# Patient Record
Sex: Male | Born: 1975 | Hispanic: No | Marital: Married | Smoking: Current every day smoker
Health system: Southern US, Community
[De-identification: ages and names within clinical notes are randomized; demographics above are authoritative.]

---

## 2002-09-25 ENCOUNTER — Encounter: Admission: RE | Admit: 2002-09-25 | Discharge: 2002-09-25 | Payer: Self-pay | Admitting: Specialist

## 2002-09-25 ENCOUNTER — Encounter: Payer: Self-pay | Admitting: Specialist

## 2004-08-20 ENCOUNTER — Emergency Department (HOSPITAL_COMMUNITY): Admission: EM | Admit: 2004-08-20 | Discharge: 2004-08-20 | Payer: Self-pay | Admitting: Emergency Medicine

## 2013-03-08 ENCOUNTER — Ambulatory Visit (INDEPENDENT_AMBULATORY_CARE_PROVIDER_SITE_OTHER): Payer: BC Managed Care – PPO | Admitting: Emergency Medicine

## 2013-03-08 VITALS — BP 118/72 | HR 113 | Temp 101.6°F | Resp 18 | Ht 66.0 in | Wt 127.0 lb

## 2013-03-08 DIAGNOSIS — R509 Fever, unspecified: Secondary | ICD-10-CM

## 2013-03-08 DIAGNOSIS — J111 Influenza due to unidentified influenza virus with other respiratory manifestations: Secondary | ICD-10-CM

## 2013-03-08 DIAGNOSIS — R059 Cough, unspecified: Secondary | ICD-10-CM

## 2013-03-08 DIAGNOSIS — J101 Influenza due to other identified influenza virus with other respiratory manifestations: Secondary | ICD-10-CM

## 2013-03-08 DIAGNOSIS — R05 Cough: Secondary | ICD-10-CM

## 2013-03-08 LAB — POCT INFLUENZA A/B
Influenza A, POC: POSITIVE
Influenza B, POC: NEGATIVE

## 2013-03-08 MED ORDER — OSELTAMIVIR PHOSPHATE 75 MG PO CAPS: 75.0000 mg | ORAL_CAPSULE | Freq: Two times a day (BID) | ORAL | Status: DC

## 2013-03-08 NOTE — Progress Notes (Signed)
   Subjective:    Patient ID: Thomas Cantu, male    DOB: 29-Aug-1975, 37 y.o.   MRN: [id number]  HPI  37y.o. Male presents to clinic today with cough, chills, congestion. Symptoms started over past 2 days. Took tylenol for symptoms yesturday  Review of Systems     Objective:   Physical Exam H. EENT exam is unremarkable except for rhinorrhea. Neck is supple chest clear to auscultation and percussion. Heart regular rate no murmurs rubs or gallops  Results for orders placed in visit on 03/08/13  POCT INFLUENZA A/B      Result Value Range   Influenza A, POC Positive     Influenza B, POC            Assessment & Plan:  Testing positive for influenza will treat with Tamiflu.

## 2013-03-08 NOTE — Patient Instructions (Addendum)
Cm A (H1N1) (Influenza A [H1N1]) H1N1 tr??c ?y ???c g?i l "cm l?n" l m?t vi rt cm m?i gy b?nh ? ng??i. Vi rt H1N1 khc v?i cc vi rt cm ma. Tuy nhin, cc tri?u ch?ng c?a H1N1 t??ng t? nh? cm ma v n ly lan t? ng??i ny sang ng??i khc. B?n c th? c nguy c? b? nh?ng v?n ?? nghim tr?ng cao h?n n?u b?n c nh?ng tnh tr?ng b?nh n?i khoa nghim tr?ng. CDC v T? Ch?c Y T? Th? Gi?i ?ang theo di cc tr??ng h?p ???c bo co trn kh?p th? gi?i.  NGUYN NHN  Cm ???c xem l ly lan ch? y?u t? ng??i sang ng??i thng qua ho ho?c h?t h?i c?a ng??i b? nhi?m b?nh.  M?t ng??i c th? b? nhi?m b?nh khi ch?m vo th? g ? c vi rt v sau ? ch?m vo mi?ng ho?c m?i h?. TRI?U CH?NG  S?t.  ?au ??u.  M?t m?i.  Ho.  ?au h?ng.  Ch?y n??c m?i ho?c ng?t m?i.  ?au nh?c ton thn.  Tiu ch?y v nn m?a Nh?ng tri?u ch?ng ny ???c ?? c?p l nh?ng tri?u ch?ng 'gi?ng nh? cm'. Nhi?u b?nh l khc nhau bao g?m, c?m l?nh, c th? c nh?ng tri?u ch?ng t??ng t?. CH?N ?ON  C nh?ng xt nghi?m c th? cho bi?t b?n b? nhi?m vi rt H1N1 hay khng.  Nh?ng tr??ng h?p ???c xc ??nh nhi?m H1N1 s? ???c bo co cho phng y t? c?a ti?u bang ho?c ??a ph??ng.  Bc s? c th? c?n khm ?? xc ??nh xem b?n c b? nhi?m trng do bi?n ch?ng c?a cm hay khng. H??NG D?N CH?M SC T?I NH  Lun c?p nh?t thng tin. Truy c?p vo trang web c?a CDC ?? bi?t nh?ng khuy?n ngh? m?i nh?t. Truy c?p vo www.cdc.gov/H1N1flu/. B?n c?ng c th? g?i ??n 1-800-CDC-INFO (1-800-232-4636).  Yu c?u tr? gip s?m n?u b?n b? b?t k? tri?u ch?ng no ? trn.  N?u b?n c nguy c? cao b? cc bi?n ch?ng c?a cm, hy ni chuy?n v?i chuyn gia ch?m sc s?c kh?e c?a b?n ngay sau khi b?n c nh?ng tri?u ch?ng gi?ng nh? cm. Nh?ng ng??i b? nguy c? b? bi?n ch?ng cao h?n bao g?m:  Nh?ng ng??i t? 63 tu?i tr? ln.  Nh?ng ng??i c nh?ng tnh tr?ng b?nh n?i khoa m?n tnh.  Ph? n? mang thai.  Tr? nh?.  Chuyn gia ch?m sc s?c kh?e c th? khuy?n ngh?  dng thu?c khng vi rt ?? gip ?i?u tr? cm.  N?u b?n b? cm, hy ngh? ng?i th?t nhi?u, u?ng ?? n??c v dung d?ch ?? gi? cho n??c ti?u trong ho?c vng nh?t v trnh s? d?ng r??u ho?c thu?c l.  B?n c th? s? d?ng thu?ckhng c?n k ??n ?? lm gi?m tri?u ch?ng cm n?u chuyn gia ch?m sc s?c kh?e ch?p thu?n. (Khng bao gi? s? d?ng aspirin cho tr? em ho?c tr? v? thnh nin c nh?ng tri?u ch?ng gi?ng nh? cm, ??c bi?t khi b? s?t). ?I?U TR? N?u b? b?nh, b?n c th? s? d?ng thu?c khng vi rt. Nh?ng thu?c ny c th? gi?m nh? b?nh c?a b?n v lm cho b?n c?m th?y kh?e nhanh h?n. Nn ?i?u tr? ngay sau khi b?t ??u b? b?nh. N ch? c hi?u qu? n?u ???c s? d?ng trong ngy ??u tin khi b?t ??u b? b?nh. Ch? chuyn gia ch?m sc s?c kh?e c?a b?n m?i c th? k ??n thu?c khng vi rt. PHNG   NG?A  Che m?i v mi?ng b?ng kh?n gi?y ho?c cnh tay khi ho ho?c h?t h?i. V?t b? kh?n gi?y.  R?a tay th??ng xuyn b?ng x phng v n??c ?m, ??c bi?t sau khi ho ho?c h?t h?i. Nh?ng ch?t t?y r?a c ch?a c?n c?ng hi?u qu? trong vi?c ch?ng l?i vi trng.  Trnh ch?m vo m?t, m?i ho?c mi?ng. ?y l m?t cch ly lan vi trng.  Trnh ti?p xc v?i ng??i b? b?nh. Tun th? khuy?n co y t? cng c?ng lin quan ??n vi?c ?ng c?a tr??ng h?c. Trnh ?m ?ng.  ? nh n?u b?n b? b?nh. H?n ch? ti?p xc v?i ng??i khc ?? trnh ly b?nh cho h?. Nh?ng ng??i b? nhi?m vi rt H1N1 c th? ly nhi?m sang ng??i khc b?t c? lc no k? t? 1 ngy tr??c khi c?m th?y b? b?nh cho ??n 5-7 ngy sau khi xu?t hi?n tri?u ch?ng cm.  V?cxin H1N1 c s?n ?? gip b?o v? ch?ng l?i vi rt. Ngoi v?cxin H1N1, b?n s? c?n tim phng cm ma. V?cxin H1N1 v v?cxin cm ma c th? ???c cho dng trong cng ngy. CDC ??c bi?t khuyn ngh? s? d?ng v?cxin H1N1 cho:  Ph? n? mang thai.  Nh?ng ng??i s?ng cng v?i ho?c ch?m sc tr? t? 6 thng tu?i tr? xu?ng.  Nhn vin d?ch v? ch?m sc s?c kh?e v c?p c?u.  Nh?ng ng??i t? 6 thng tu?i ??n 24 tu?i.  Nh?ng ng??i t? 25 ??n 64 tu?i c  nguy c? b? nhi?m H1N1 cao h?n do cc r?i lo?n s?c kh?e m?n tnh ho?c v?n ?? v? h? mi?n d?ch. M?T N? Trong mi tr??ng ? c?ng ??ng v t?i nh, vi?c s? d?ng m?t n? v kh?u trang N95 th??ng khng ???c khuyn dng. Trong m?t s? tr??ng h?p nh?t ??nh, m?t n? ho?c kh?u trang N95 c th? ???c s? d?ng cho ng??i c t?ng nguy c? b? b?nh n?ng t? cm. Chuyn gia ch?m sc s?c kh?e c?a b?n c th? t? v?n thm v? vi?c s? d?ng m?t n?. ? TR? EM, NH?NG D?U HI?U C?NH BO C?P C?U C?N ?I KHM KH?N C?P:  Th? nhanh ho?c kh th?.  Mu da h?i xanh.  Khng u?ng ?? n??c.  Khng th?c gi?c ho?c khng t??ng tc bnh th??ng.  Kh tnh t?i m?c tr? khng mu?n ???c m.  Nhi?t ?? ? mi?ng c?a con b?n trn 102 F (38,9 C), khng h? khi dng thu?c.  Con b?n trn 3 thng tu?i c nhi?t ?? ?o t?i tr?c trng l 102 F (38,9 C) tr? ln.  Con b?n t? 3 thng tu?i tr? xu?ng c nhi?t ?? ?o t?i tr?c trng l 100,4 F (38 C) tr? ln.  Tri?u ch?ng gi?ng nh? cm c?i thi?n nh?ng ti pht km theo s?t v ho t? h?n. ? NG??I L?N, NH?NG D?U HI?U C?NH BO C?P C?U C?N ?I KHM KH?N C?P:  Kh th? ho?c th? d?c.  ?au ho?c t?c ng?c ho?c b?ng.  Chng m?t ??t ng?t.  L l?n.  Nn n?ng ho?c lin t?c.  Da h?i xanh.  Nhi?t ?? mi?ng c?a b?n trn 102 F (38,9 C), khng h? khi dng thu?c.  Tri?u ch?ng gi?ng nh? cm c?i thi?n nh?ng ti pht km theo s?t v ho t? h?n. HY NGAY L?P T?C ?I KHM N?U: B?n ho?c ai ? b?n bi?t g?p ph?i b?t k? tri?u ch?ng no ? trn. Khi b?n ??n trung tm c?p c?u, hy bo r?ng b?n ngh? r?ng b?n   b? cm. B?n c th? ???c yu c?u ?eo m?t n? v/ho?c ng?i trong khu v?c cch ly ?? b?o v? nh?ng ng??i khc kh?i b? ly b?nh. ??M B?O B?N:  Hi?u cc h??ng d?n ny.  S? theo di tnh tr?ng c?a mnh.  S? yu c?u tr? gip ngay l?p t?c n?u b?n c?m th?y khng ?? ho?c tnh tr?ng tr?m tr?ng h?n. M?t s? thng tin ny ???c cung c?p mi?n ph b?i CDC. Document Released: 05/20/2009 Document Revised: 10/26/2012 ExitCare Patient  Information 2014 [employer].  

## 2015-02-01 ENCOUNTER — Ambulatory Visit (INDEPENDENT_AMBULATORY_CARE_PROVIDER_SITE_OTHER): Payer: BLUE CROSS/BLUE SHIELD | Admitting: Family Medicine

## 2015-02-01 ENCOUNTER — Ambulatory Visit (INDEPENDENT_AMBULATORY_CARE_PROVIDER_SITE_OTHER): Payer: BLUE CROSS/BLUE SHIELD

## 2015-02-01 VITALS — BP 136/80 | HR 100 | Temp 97.6°F | Resp 18 | Ht 66.0 in | Wt 129.2 lb

## 2015-02-01 DIAGNOSIS — S93402A Sprain of unspecified ligament of left ankle, initial encounter: Secondary | ICD-10-CM

## 2015-02-01 DIAGNOSIS — M25472 Effusion, left ankle: Secondary | ICD-10-CM | POA: Diagnosis not present

## 2015-02-01 DIAGNOSIS — M25572 Pain in left ankle and joints of left foot: Secondary | ICD-10-CM

## 2015-02-01 DIAGNOSIS — S99912A Unspecified injury of left ankle, initial encounter: Secondary | ICD-10-CM | POA: Diagnosis not present

## 2015-02-01 NOTE — Progress Notes (Signed)
Xray read and patient discussed with Ms. Leone PayorGessner. Agree with assessment and plan of care per her note. XR report reviewed without fx.

## 2015-02-01 NOTE — Progress Notes (Signed)
   Subjective:    Patient ID: Thomas Cantu, male    DOB: 01/23/1976, 39 y.o.   MRN: [id number]  HPI Patient presents today with pain and swelling in his left foot following rolling it while walking down the stairs last night. He was able to catch himself and did not fall down. He did not have pain or swelling immediately following the incident, but had pain when he got up this morning and noticed that his ankle is swollen. He has not applied ice or taken any medication.   He works as a Radio broadcast assistantnail tech.   History reviewed. No pertinent past medical history. History reviewed. No pertinent past surgical history. Family History  Problem Relation Age of Onset  . Diabetes Mother   . Diabetes Father    Social History  Substance Use Topics  . Smoking status: Current Every Day Smoker -- 0.50 packs/day    Types: Cigarettes  . Smokeless tobacco: None  . Alcohol Use: None    Review of Systems Per HPI    Objective:   Physical Exam  Constitutional: He is oriented to person, place, and time. He appears well-developed and well-nourished.  HENT:  Head: Normocephalic and atraumatic.  Eyes: Conjunctivae are normal.  Neck: Normal range of motion. Neck supple.  Cardiovascular: Normal rate.   Pulmonary/Chest: Effort normal.  Musculoskeletal:       Left ankle: He exhibits normal range of motion.       Left foot: There is tenderness and swelling. There is normal range of motion and normal capillary refill.  Moderate swelling over left lateral foot, slight discoloration. Pain with flexion, extension, inversion, no pain with inversion. No pain over fibula or with tib-fib squeeze. No pain over 5th metatarsal. No navicular pain.   Neurological: He is alert and oriented to person, place, and time.  Skin: Skin is warm and dry.  Psychiatric: He has a normal mood and affect. His behavior is normal. Judgment and thought content normal.  Vitals reviewed.  BP 136/80 mmHg  Pulse 100  Temp(Src) 97.6 F (36.4 C)  (Oral)  Resp 18  Ht 5\' 6"  (1.676 m)  Wt 129 lb 3.2 oz (58.605 kg)  BMI 20.86 kg/m2  SpO2 98%   [facility] reading (PRIMARY) by  Dr. Neva SeatGreene- no fracture.  Assessment & Plan:  1. Ankle injury, left, initial encounter - DG Foot Complete Left; Future  2. Pain in joint, ankle and foot, left - DG Foot Complete Left; Future  3. Ankle swelling, left - DG Foot Complete Left; Future  4. Ankle sprain, left -Provided written and verbal information regarding diagnosis and treatment. - discussed normal Xray - encouraged elevation, ace bandage applied, recommended ibuprofen 400 mg TID - RTC if no improvement in 5-7 days  Olean Reeeborah Gessner, FNP-BC  [facility], [facility]  02/01/2015 9:46 AM

## 2015-02-01 NOTE — Patient Instructions (Signed)
Bong Gân C? Chân  (Ankle Sprain)  Bong gân c? chân là t?n th??ng các mô x?, ch?c ch?n (dây ch?ng) gi? cho các x??ng c?a kh?p m?t cá chân v?i nhau.   NGUYÊN NHÂN  Bong gân c? chân th??ng do té ngã ho?c v?n c? chân. Bong gân c? chân th??ng hay x?y ra nh?t khi b?n b??c ?i trên c?nh ngoài c?a chân mình và m?t cá chân xoay vào trong. Nh?ng ng??i tham gia vào các môn th? thao d? b? các lo?i ch?n th??ng này h?n.  TRI?U CH?NG  · ?au m?t ? c? chân. C?n ?au có th? xu?t hi?n khi ngh? ng?i ho?c ch? khi ??ng ho?c ?i b?.  · S?ng t?y.  · B?m tím. B?m tím có th? b? ngay l?p t?c ho?c trong vòng 1-2 ngày sau khi b? ch?n th??ng.  · Khó ??ng ho?c khó ?i b?, ??c bi?t là r? hay thay ??i h??ng.  CH?N ?OÁN  Chuyên gia ch?m sóc s?c kh?e s? h?i b?n thông tin chi ti?t v? t?n th??ng và th?c hi?n khám th?c th? c? chân ?? xác ??nh b?n có b? bong gân c? chân hay không. Trong khi khám th?c th?, chuyên gia ch?m sóc s?c kh?e s? ?n vào và ép lên các vùng c? th? c?a bàn chân và c? chân c?a b?n. Chuyên gia ch?m sóc s?c kh?e s? tìm cách di chuy?n c? chân c?a b?n theo m?t s? chi?u nh?t ??nh. Ki?m tra b?ng ch?p X-quang có th? ???c th?c hi?n ?? ch?c ch?n là không có x??ng b? gãy ho?c không có dây ch?ng b? tách r?i kh?i m?t trong các x??ng ? c? chân c?a b?n (gãy b?t x??ng).  ?I?U TR?  M?t s? lo?i ni?ng nh?t ??nh có th? giúp làm ?n ??nh c? chân. Chuyên gia ch?m sóc s?c kh?e có th? ?? ngh? s? d?ng nh?ng lo?i ni?ng này. Chuyên gia ch?m sóc s?c kh?e có th? khuy?n ngh? s? d?ng thu?c gi?m ?au. N?u bong gân n?ng, chuyên gia ch?m sóc s?c kh?e có th? gi?i thi?u b?n ??n m?t bác s? ph?u thu?t giúp ph?c h?i ch?c n?ng cho các b? ph?n c?a h? th?ng x??ng c?a b?n (ch?nh hình) ho?c chuyên gia v?t lý tr? li?u.  H??NG D?N CH?M SÓC T?I NHÀ  · Ch??m ?á vào ch? b? t?n th??ng t? 1-2 ngày ho?c theo ch? d?n c?a chuyên gia ch?m sóc s?c kh?e. Vi?c ch??m ?á s? giúp gi?m viêm và gi?m ?au.    Cho ?á l?nh vào túi nh?a.    ??t kh?n t?m gi?a da và túi.    Ch??m ?á l?nh trong 15 ??n 20 phút  m?i l?n, 2 gi? m?t l?n trong khi th?c.  · Ch? s? d?ng thu?c không c?n kê toa ho?c thu?c c?n kê toa ?? gi?m ?au, gi?m c?m giác khó ch?u ho?c h? s?t theo ch? d?n c?a chuyên gia ch?m sóc s?c kh?e c?a b?n.  · Nâng c? chân b? th??ng cao h?n tim c?a b?n càng nhi?u càng t?t trong 2-3 ngày.  · N?u chuyên gia ch?m sóc s?c kh?e ?? ngh? s? d?ng n?ng, hãy s? d?ng chúng theo h??ng d?n. D?n d?n ?? tr?ng l??ng c? th? tì lên c? chân b? ?nh h??ng. Ti?p t?c s? d?ng n?ng ho?c g?y cho ??n khi b?n có th? ?i b? mà không có c?m giác ?au ? c? chân.  · N?u b?n ???c bó b?t, hãy gi? b?t theo ch? d?n c?a chuyên gia ch?m sóc s?c   t ln ch? b b?t. Khng ?? b?t b? ??t. B?n c th? tho b?t ?? t?m vi sen ho?c b?n t?m.  B?n c th? ? ???c ch? ??nh b?ng ?n h?i ?? ?eo quanh m?t c chn ?? h? tr?. N?u b?ng ?n h?i qu ch?t (b?n c c?m gic t ho?c ?au bu?t ? bn chn ho?c bn chn b?n tr? nn l?nh v xanh), hy ?i?u ch?nh b?ng ?? lm cho n tho?i mi.  N?u s? d?ng n?p kh, b?n c th? th?i thm kh vo n?p ho?c x? b?t kh ra ?? c c?m gic tho?i mi h?n. B?n c th? tho n?p vo ban ?m v tr??c khi t?m vi sen ho?c t?m b?n. Ng? ngu?y ngn chn trong n?p vi l?n trong ngy ?? gi?m s?ng. HY ?I KHM N?U:  B?n b? thm tm ho?c s?ng gia t?ng r?t nhanh.  B?n c?m th?y v cng l?nh ? cc ngn chn ho?c b?n m?t c?m gic ? bn chn.  B?n khng h?t ?au sau khi dng thu?c. HY NGAY L?P T?C ?I KHM N?U:  Ngn chn c?a b?n t ho?c xanh.  B?n b? ?au cng ngy cng n?ng ln. HY CH?C CH?N R?NG B?N:  Hi?u r nh?ng h??ng d?n ny.  S? theo di tnh tr?ng b?nh c?a b?n.  S? yu c?u tr? gip n?u b?n khng ?? ho?c tnh tr?ng tr?m tr?ng h?n.   Thng tin ny khng nh?m m?c ?ch thay th? cho l?i khuyn m chuyn gia ch?m [place] s?c kh?e ni v?i qu v?. Hy b?o ??m qu v? ph?i th?o lu?n b?t k? v?n ?? g m qu v? c v?i chuyn gia ch?m [place] s?c  kh?e c?a qu v?.   Document Released: 02/23/2005 Document Revised: 10/26/2012 Elsevier Interactive Patient Education [employer]

## 2016-01-14 ENCOUNTER — Ambulatory Visit (INDEPENDENT_AMBULATORY_CARE_PROVIDER_SITE_OTHER): Payer: Self-pay | Admitting: Family Medicine

## 2016-01-14 VITALS — BP 122/72 | HR 96 | Temp 98.2°F | Resp 17 | Ht 66.5 in | Wt 131.0 lb

## 2016-01-14 DIAGNOSIS — B029 Zoster without complications: Secondary | ICD-10-CM

## 2016-01-14 MED ORDER — VALACYCLOVIR HCL 1 G PO TABS: 1000.0000 mg | ORAL_TABLET | Freq: Three times a day (TID) | ORAL | 0 refills | Status: DC

## 2016-01-14 MED ORDER — HYDROCODONE-ACETAMINOPHEN 5-325 MG PO TABS: 1.0000 | ORAL_TABLET | Freq: Four times a day (QID) | ORAL | 0 refills | Status: DC | PRN

## 2016-01-14 NOTE — Patient Instructions (Addendum)
Ibuprofen 600 mg up to every 6 hours as needed with food. For increased pain, can take 1-2 hydrocodone every 6 hours as needed. Start Valtrex 1 pill 3 times per day for shingles.   Shingles Shingles, which is also known as herpes zoster, is an infection that causes a painful skin rash and fluid-filled blisters. Shingles is not related to genital herpes, which is a sexually transmitted infection.   Shingles only develops in people who:  Have had chickenpox.  Have received the chickenpox vaccine. (This is rare.) CAUSES Shingles is caused by varicella-zoster virus (VZV). This is the same virus that causes chickenpox. After exposure to VZV, the virus stays in the body in an inactive (dormant) state. Shingles develops if the virus reactivates. This can happen many years after the initial exposure to VZV. It is not known what causes this virus to reactivate. RISK FACTORS People who have had chickenpox or received the chickenpox vaccine are at risk for shingles. Infection is more common in people who:  Are older than age 42.  Have a weakened defense (immune) system, such as those with HIV, AIDS, or cancer.  Are taking medicines that weaken the immune system, such as transplant medicines.  Are under great stress. SYMPTOMS Early symptoms of this condition include itching, tingling, and pain in an area on your skin. Pain may be described as burning, stabbing, or throbbing. A few days or weeks after symptoms start, a painful red rash appears, usually on one side of the body in a bandlike or beltlike pattern. The rash eventually turns into fluid-filled blisters that break open, scab over, and dry up in about 2-3 weeks. At any time during the infection, you may also develop:  A fever.  Chills.  A headache.  An upset stomach. DIAGNOSIS This condition is diagnosed with a skin exam. Sometimes, skin or fluid samples are taken from the blisters before a diagnosis is made. These samples are examined  under a microscope or sent to a lab for testing. TREATMENT There is no specific cure for this condition. Your health care provider will probably prescribe medicines to help you manage pain, recover more quickly, and avoid long-term problems. Medicines may include:  Antiviral drugs.  Anti-inflammatory drugs.  Pain medicines. If the area involved is on your face, you may be referred to a specialist, such as an eye doctor (ophthalmologist) or an ear, nose, and throat (ENT) doctor to help you avoid eye problems, chronic pain, or disability. HOME CARE INSTRUCTIONS Medicines  Take medicines only as directed by your health care provider.  Apply an anti-itch or numbing cream to the affected area as directed by your health care provider. Blister and Rash Care  Take a cool bath or apply cool compresses to the area of the rash or blisters as directed by your health care provider. This may help with pain and itching.  Keep your rash covered with a loose bandage (dressing). Wear loose-fitting clothing to help ease the pain of material rubbing against the rash.  Keep your rash and blisters clean with mild soap and cool water or as directed by your health care provider.  Check your rash every day for signs of infection. These include redness, swelling, and pain that lasts or increases.  Do not pick your blisters.  Do not scratch your rash. General Instructions  Rest as directed by your health care provider.  Keep all follow-up visits as directed by your health care provider. This is important.  Until your blisters scab  over, your infection can cause chickenpox in people who have never had it or been vaccinated against it. To prevent this from happening, avoid contact with other people, especially:  Babies.  Pregnant women.  Children who have eczema.  Elderly people who have transplants.  People who have chronic illnesses, such as leukemia or AIDS. SEEK MEDICAL CARE IF:  Your pain is  not relieved with prescribed medicines.  Your pain does not get better after the rash heals.  Your rash looks infected. Signs of infection include redness, swelling, and pain that lasts or increases. SEEK IMMEDIATE MEDICAL CARE IF:  The rash is on your face or nose.  You have facial pain, pain around your eye area, or loss of feeling on one side of your face.  You have ear pain or you have ringing in your ear.  You have loss of taste.  Your condition gets worse.   This information is not intended to replace advice given to you by your health care provider. Make sure you discuss any questions you have with your health care provider.   Document Released: 02/23/2005 Document Revised: 03/16/2014 Document Reviewed: 01/04/2014 Elsevier Interactive Patient Education 2016 [employer]    IF you received an x-ray today, you will receive an invoice from [facility]. Please contact [facility] at [phone] with questions or concerns regarding your invoice.   IF you received labwork today, you will receive an invoice from [facility]. Please contact [facility] at [phone] with questions or concerns regarding your invoice.   Our billing staff will not be able to assist you with questions regarding bills from these companies.  You will be contacted with the lab results as soon as they are available. The fastest way to get your results is to activate your My Chart account. Instructions are located on the last page of this paperwork. If you have not heard from us regarding the results in 2 weeks, please contact this office.

## 2016-01-14 NOTE — Progress Notes (Signed)
By signing my name below, I, Mesha Guinyard, attest that this documentation has been prepared under the direction and in the presence of Meredith Staggers, MD.  Electronically Signed: Arvilla Market, Medical Scribe. 01/14/16. 10:22 AM.  Subjective:    Patient ID: Thomas Cantu, male    DOB: 1975-05-15, 40 y.o.   MRN: [id number]  HPI Chief Complaint  Patient presents with  . Rash    HPI Comments: Thomas Cantu is a 40 y.o. male who presents to the [facility] complaining of rash on his left arm and left side of chest onset 3 days. Reports associated sx's of burning and itching from sites as well as sleep disturbance. Uses OTC cream and advil without relief to his symptoms. Deneis fever, and feeling ill.  There are no active problems to display for this patient.  No past medical history on file. No past surgical history on file. No Known Allergies Prior to Admission medications   Not on File   Social History   Social History  . Marital status: Married    Spouse name: N/A  . Number of children: N/A  . Years of education: N/A   Occupational History  . Not on file.   Social History Main Topics  . Smoking status: Current Every Day Smoker    Packs/day: 0.75    Years: 20.00    Types: Cigarettes  . Smokeless tobacco: Never Used  . Alcohol use No  . Drug use: No  . Sexual activity: No   Other Topics Concern  . Not on file   Social History Narrative  . No narrative on file   Review of Systems  Constitutional: Negative for fever.  Skin: Positive for rash.  Psychiatric/Behavioral: Positive for sleep disturbance.   Objective:  Physical Exam  Constitutional: He appears well-developed and well-nourished. No distress.  HENT:  Head: Normocephalic and atraumatic.  Eyes: Conjunctivae are normal.  Neck: Neck supple.  Cardiovascular: Normal rate.   Pulmonary/Chest: Effort normal.  Neurological: He is alert.  Skin: Skin is warm and dry.  Left upper chest  wall; there is a patch of erythema with vesicle centrally Left under surface of arm; there are other patches of vesicles overlying erythema Appears to be probable T1 distribution  Psychiatric: He has a normal mood and affect. His behavior is normal.  Nursing note and vitals reviewed.  BP 122/72 (BP Location: Right Arm, Patient Position: Sitting, Cuff Size: Normal)   Pulse 96   Temp 98.2 F (36.8 C) (Oral)   Resp 17   Ht 5' 6.5" (1.689 m)   Wt 131 lb (59.4 kg)   SpO2 98%   BMI 20.83 kg/m  Assessment & Plan:   Thomas Cantu is a 40 y.o. male Herpes zoster without complication - Plan: valACYclovir (VALTREX) 1000 MG tablet, HYDROcodone-acetaminophen (NORCO/VICODIN) 5-325 MG tablet  - Rash appears to be consistent with probable T1 herpes zoster on the left. Start Valtrex 1 g 3 times a day, continue ibuprofen as needed during the day, but for increased or breakthrough pain, hydrocodone prescription given. RTC precautions. Understanding expressed. Actuary, but declined.   Meds ordered this encounter  Medications  . valACYclovir (VALTREX) 1000 MG tablet    Sig: Take 1 tablet (1,000 mg total) by mouth 3 (three) times daily.    Dispense:  21 tablet    Refill:  0  . HYDROcodone-acetaminophen (NORCO/VICODIN) 5-325 MG tablet    Sig: Take 1-2 tablets by mouth every 6 (  six) hours as needed for moderate pain.    Dispense:  20 tablet    Refill:  0    Label in vietnamese   Patient Instructions   Ibuprofen 600 mg up to every 6 hours as needed with food. For increased pain, can take 1-2 hydrocodone every 6 hours as needed. Start Valtrex 1 pill 3 times per day for shingles.   Shingles Shingles, which is also known as herpes zoster, is an infection that causes a painful skin rash and fluid-filled blisters. Shingles is not related to genital herpes, which is a sexually transmitted infection.   Shingles only develops in people who:  Have had chickenpox.  Have received the chickenpox  vaccine. (This is rare.) CAUSES Shingles is caused by varicella-zoster virus (VZV). This is the same virus that causes chickenpox. After exposure to VZV, the virus stays in the body in an inactive (dormant) state. Shingles develops if the virus reactivates. This can happen many years after the initial exposure to VZV. It is not known what causes this virus to reactivate. RISK FACTORS People who have had chickenpox or received the chickenpox vaccine are at risk for shingles. Infection is more common in people who:  Are older than age 42.  Have a weakened defense (immune) system, such as those with HIV, AIDS, or cancer.  Are taking medicines that weaken the immune system, such as transplant medicines.  Are under great stress. SYMPTOMS Early symptoms of this condition include itching, tingling, and pain in an area on your skin. Pain may be described as burning, stabbing, or throbbing. A few days or weeks after symptoms start, a painful red rash appears, usually on one side of the body in a bandlike or beltlike pattern. The rash eventually turns into fluid-filled blisters that break open, scab over, and dry up in about 2-3 weeks. At any time during the infection, you may also develop:  A fever.  Chills.  A headache.  An upset stomach. DIAGNOSIS This condition is diagnosed with a skin exam. Sometimes, skin or fluid samples are taken from the blisters before a diagnosis is made. These samples are examined under a microscope or sent to a lab for testing. TREATMENT There is no specific cure for this condition. Your health care provider will probably prescribe medicines to help you manage pain, recover more quickly, and avoid long-term problems. Medicines may include:  Antiviral drugs.  Anti-inflammatory drugs.  Pain medicines. If the area involved is on your face, you may be referred to a specialist, such as an eye doctor (ophthalmologist) or an ear, nose, and throat (ENT) doctor to help  you avoid eye problems, chronic pain, or disability. HOME CARE INSTRUCTIONS Medicines  Take medicines only as directed by your health care provider.  Apply an anti-itch or numbing cream to the affected area as directed by your health care provider. Blister and Rash Care  Take a cool bath or apply cool compresses to the area of the rash or blisters as directed by your health care provider. This may help with pain and itching.  Keep your rash covered with a loose bandage (dressing). Wear loose-fitting clothing to help ease the pain of material rubbing against the rash.  Keep your rash and blisters clean with mild soap and cool water or as directed by your health care provider.  Check your rash every day for signs of infection. These include redness, swelling, and pain that lasts or increases.  Do not pick your blisters.  Do not scratch  your rash. General Instructions  Rest as directed by your health care provider.  Keep all follow-up visits as directed by your health care provider. This is important.  Until your blisters scab over, your infection can cause chickenpox in people who have never had it or been vaccinated against it. To prevent this from happening, avoid contact with other people, especially:  Babies.  Pregnant women.  Children who have eczema.  Elderly people who have transplants.  People who have chronic illnesses, such as leukemia or AIDS. SEEK MEDICAL CARE IF:  Your pain is not relieved with prescribed medicines.  Your pain does not get better after the rash heals.  Your rash looks infected. Signs of infection include redness, swelling, and pain that lasts or increases. SEEK IMMEDIATE MEDICAL CARE IF:  The rash is on your face or nose.  You have facial pain, pain around your eye area, or loss of feeling on one side of your face.  You have ear pain or you have ringing in your ear.  You have loss of taste.  Your condition gets worse.   This  information is not intended to replace advice given to you by your health care provider. Make sure you discuss any questions you have with your health care provider.   Document Released: 02/23/2005 Document Revised: 03/16/2014 Document Reviewed: 01/04/2014 Elsevier Interactive Patient Education 2016 [employer]    IF you received an x-ray today, you will receive an invoice from [facility]. Please contact [facility] at [phone] with questions or concerns regarding your invoice.   IF you received labwork today, you will receive an invoice from [facility]. Please contact [facility] at [phone] with questions or concerns regarding your invoice.   Our billing staff will not be able to assist you with questions regarding bills from these companies.  You will be contacted with the lab results as soon as they are available. The fastest way to get your results is to activate your My Chart account. Instructions are located on the last page of this paperwork. If you have not heard from us regarding the results in 2 weeks, please contact this office.        I personally performed the services described in this documentation, which was scribed in my presence. The recorded information has been reviewed and considered, and addended by me as needed.   Signed,   Meredith StaggersJeffrey Alyra Patty, MD [facility].  01/14/16 10:35 AM

## 2017-11-02 ENCOUNTER — Other Ambulatory Visit: Payer: Self-pay | Admitting: Internal Medicine

## 2017-11-02 ENCOUNTER — Ambulatory Visit
Admission: RE | Admit: 2017-11-02 | Discharge: 2017-11-02 | Disposition: A | Discharge status: Home / Self Care | Payer: No Typology Code available for payment source | Source: Ambulatory Visit | Attending: Internal Medicine | Admitting: Internal Medicine

## 2017-11-02 DIAGNOSIS — Z111 Encounter for screening for respiratory tuberculosis: Secondary | ICD-10-CM

## 2021-02-19 ENCOUNTER — Other Ambulatory Visit: Payer: Self-pay | Admitting: Nurse Practitioner

## 2021-02-19 DIAGNOSIS — B181 Chronic viral hepatitis B without delta-agent: Secondary | ICD-10-CM

## 2021-02-24 ENCOUNTER — Ambulatory Visit
Admission: RE | Admit: 2021-02-24 | Discharge: 2021-02-24 | Disposition: A | Discharge status: Home / Self Care | Payer: 59 | Source: Ambulatory Visit | Attending: Nurse Practitioner | Admitting: Nurse Practitioner

## 2021-02-24 DIAGNOSIS — B181 Chronic viral hepatitis B without delta-agent: Secondary | ICD-10-CM

## 2021-04-02 DIAGNOSIS — R69 Illness, unspecified: Secondary | ICD-10-CM | POA: Diagnosis not present

## 2021-05-21 DIAGNOSIS — K7401 Hepatic fibrosis, early fibrosis: Secondary | ICD-10-CM | POA: Diagnosis not present

## 2021-05-21 DIAGNOSIS — K7581 Nonalcoholic steatohepatitis (NASH): Secondary | ICD-10-CM | POA: Diagnosis not present

## 2021-05-21 DIAGNOSIS — K760 Fatty (change of) liver, not elsewhere classified: Secondary | ICD-10-CM | POA: Diagnosis not present

## 2021-05-21 DIAGNOSIS — K7402 Hepatic fibrosis, advanced fibrosis: Secondary | ICD-10-CM | POA: Diagnosis not present

## 2021-05-21 DIAGNOSIS — R69 Illness, unspecified: Secondary | ICD-10-CM | POA: Diagnosis not present

## 2021-08-07 ENCOUNTER — Other Ambulatory Visit: Payer: Self-pay | Admitting: Nurse Practitioner

## 2021-08-07 DIAGNOSIS — B181 Chronic viral hepatitis B without delta-agent: Secondary | ICD-10-CM

## 2021-08-07 DIAGNOSIS — K760 Fatty (change of) liver, not elsewhere classified: Secondary | ICD-10-CM

## 2021-08-12 ENCOUNTER — Ambulatory Visit
Admission: RE | Admit: 2021-08-12 | Discharge: 2021-08-12 | Disposition: A | Discharge status: Home / Self Care | Payer: 59 | Source: Ambulatory Visit | Attending: Nurse Practitioner | Admitting: Nurse Practitioner

## 2021-08-12 DIAGNOSIS — R69 Illness, unspecified: Secondary | ICD-10-CM | POA: Diagnosis not present

## 2021-08-12 DIAGNOSIS — K760 Fatty (change of) liver, not elsewhere classified: Secondary | ICD-10-CM

## 2021-08-12 DIAGNOSIS — B181 Chronic viral hepatitis B without delta-agent: Secondary | ICD-10-CM

## 2021-11-07 ENCOUNTER — Emergency Department (HOSPITAL_COMMUNITY)
Admission: EM | Admit: 2021-11-07 | Discharge: 2021-11-07 | Disposition: A | Discharge status: Home / Self Care | Payer: 59 | Attending: Emergency Medicine | Admitting: Emergency Medicine

## 2021-11-07 ENCOUNTER — Emergency Department (HOSPITAL_COMMUNITY): Payer: 59

## 2021-11-07 ENCOUNTER — Other Ambulatory Visit: Payer: Self-pay

## 2021-11-07 DIAGNOSIS — R110 Nausea: Secondary | ICD-10-CM | POA: Diagnosis not present

## 2021-11-07 DIAGNOSIS — R42 Dizziness and giddiness: Secondary | ICD-10-CM | POA: Diagnosis not present

## 2021-11-07 DIAGNOSIS — R519 Headache, unspecified: Secondary | ICD-10-CM | POA: Diagnosis not present

## 2021-11-07 DIAGNOSIS — H5500 Unspecified nystagmus: Secondary | ICD-10-CM | POA: Insufficient documentation

## 2021-11-07 DIAGNOSIS — J019 Acute sinusitis, unspecified: Secondary | ICD-10-CM | POA: Insufficient documentation

## 2021-11-07 DIAGNOSIS — Z743 Need for continuous supervision: Secondary | ICD-10-CM | POA: Diagnosis not present

## 2021-11-07 LAB — CBC WITH DIFFERENTIAL/PLATELET
Abs Immature Granulocytes: 0.03 10*3/uL (ref 0.00–0.07)
Basophils Absolute: 0 10*3/uL (ref 0.0–0.1)
Basophils Relative: 1 %
Eosinophils Absolute: 0.1 10*3/uL (ref 0.0–0.5)
Eosinophils Relative: 1 %
HCT: 41.3 % (ref 39.0–52.0)
Hemoglobin: 14.7 g/dL (ref 13.0–17.0)
Immature Granulocytes: 1 %
Lymphocytes Relative: 13 %
Lymphs Abs: 0.8 10*3/uL (ref 0.7–4.0)
MCH: 33.3 pg (ref 26.0–34.0)
MCHC: 35.6 g/dL (ref 30.0–36.0)
MCV: 93.4 fL (ref 80.0–100.0)
Monocytes Absolute: 0.4 10*3/uL (ref 0.1–1.0)
Monocytes Relative: 6 %
Neutro Abs: 5.1 10*3/uL (ref 1.7–7.7)
Neutrophils Relative %: 78 %
Platelets: 213 10*3/uL (ref 150–400)
RBC: 4.42 MIL/uL (ref 4.22–5.81)
RDW: 12.1 % (ref 11.5–15.5)
WBC: 6.4 10*3/uL (ref 4.0–10.5)
nRBC: 0 % (ref 0.0–0.2)

## 2021-11-07 LAB — BASIC METABOLIC PANEL
Anion gap: 10 (ref 5–15)
BUN: 13 mg/dL (ref 6–20)
CO2: 24 mmol/L (ref 22–32)
Calcium: 9.1 mg/dL (ref 8.9–10.3)
Chloride: 103 mmol/L (ref 98–111)
Creatinine, Ser: 0.75 mg/dL (ref 0.61–1.24)
GFR, Estimated: >60 mL/min (ref >60)
Glucose, Bld: 136 mg/dL — ABNORMAL HIGH (ref 70–99)
Potassium: 4.1 mmol/L (ref 3.5–5.1)
Sodium: 137 mmol/L (ref 135–145)

## 2021-11-07 LAB — CBG MONITORING, ED: Glucose-Capillary: 136 mg/dL — ABNORMAL HIGH (ref 70–99)

## 2021-11-07 MED ORDER — ONDANSETRON 4 MG PO TBDP: 4.0000 mg | ORAL_TABLET | ORAL | 0 refills | Status: DC | PRN

## 2021-11-07 MED ORDER — AMOXICILLIN-POT CLAVULANATE 875-125 MG PO TABS: 1.0000 | ORAL_TABLET | Freq: Two times a day (BID) | ORAL | 0 refills | Status: DC

## 2021-11-07 MED ORDER — SODIUM CHLORIDE 0.9 % IV BOLUS
1000.0000 mL | Freq: Once | INTRAVENOUS | Status: AC
  Administered 2021-11-07: 1000 mL via INTRAVENOUS

## 2021-11-07 MED ORDER — DIAZEPAM 5 MG/ML IJ SOLN
2.5000 mg | Freq: Once | INTRAMUSCULAR | Status: AC
  Administered 2021-11-07: 2.5 mg via INTRAVENOUS
  Filled 2021-11-07: qty 2

## 2021-11-07 MED ORDER — MECLIZINE HCL 25 MG PO TABS
25.0000 mg | ORAL_TABLET | Freq: Once | ORAL | Status: AC
  Administered 2021-11-07: 25 mg via ORAL
  Filled 2021-11-07: qty 1

## 2021-11-07 MED ORDER — MECLIZINE HCL 25 MG PO TABS: 25.0000 mg | ORAL_TABLET | Freq: Three times a day (TID) | ORAL | 0 refills | Status: AC | PRN

## 2021-11-07 NOTE — ED Notes (Signed)
Patient transported to MRI 

## 2021-11-07 NOTE — ED Triage Notes (Signed)
Pt via EMS from home for eval of sudden onset of dizziness when he got up to brush his teeth this morning. Dizziness subsides when lying down. Also c/o n/v and ear pain. Dry heaving with EMS.

## 2021-11-07 NOTE — Discharge Instructions (Addendum)
1.  Go to the pharmacy and fill your prescription for Augmentin.  This is the antibiotic to take for a sinus infection. 2.  The meclizine is to take for vertigo.  This helps with the symptom of dizziness.  It does not cure the condition. 3.  Zofran as a medication for nausea.  You may take this if you are very nauseated with dizziness. 4.  Call Dr. Avel Sensor office.  He is an ear nose throat specialist.  Try to schedule an appointment within 1 to 2 weeks. 5.  You may use the number in your discharge instruction for physician referrals.  You call this number you will get help finding a family doctor.  I have also included a resource guide for cost medical care.  May call these numbers to schedule an appointment. 6.  Return to the emergency department if you have new worsening or concerning symptoms.

## 2021-11-07 NOTE — ED Provider Notes (Signed)
[facility] EMERGENCY DEPARTMENT Provider Note   CSN: [id number] Arrival date & time: 11/07/21  4098     History  Chief Complaint  Patient presents with   Dizziness   Emesis    Thomas Cantu is a 46 y.o. male.  HPI Patient reports at baseline he is healthy.  He does not have any significant medical problems.  He felt well when he went to bed last night.  He reports he woke up this morning and went into brush his teeth.  He reports that he started to feel dizzy he describes a spinning quality.  He reports it was not too severe so he went back to bed.  Patient denies he had any sudden onset of headache or blurred vision or double vision.  He did however have to help support himself to keep from falling over.  When he got back out of bed again his symptoms were worse.  At that time when he sat up he immediately started vomiting.  He reports he feels like things are moving around and they are somewhat better lying flat but severe if sitting upright.  Has never had similar experience.  He reports 2 days ago it seemed like for a temporary period of time the hearing in his right ear went out but then it came right back again.  Did not have any other ringing or loss of hearing.  No Recent fever chills or general illness.    Home Medications Prior to Admission medications   Medication Sig Start Date End Date Taking? Authorizing Provider  amoxicillin-clavulanate (AUGMENTIN) 875-125 MG tablet Take 1 tablet by mouth every 12 (twelve) hours. 11/07/21  Yes Arby Barrette, MD  meclizine (ANTIVERT) 25 MG tablet Take 1 tablet (25 mg total) by mouth 3 (three) times daily as needed for dizziness. 11/07/21  Yes Arby Barrette, MD  ondansetron (ZOFRAN-ODT) 4 MG disintegrating tablet Take 1 tablet (4 mg total) by mouth every 4 (four) hours as needed for nausea or vomiting. 11/07/21  Yes Arby Barrette, MD  HYDROcodone-acetaminophen (NORCO/VICODIN) 5-325 MG tablet Take 1-2 tablets by mouth every 6  (six) hours as needed for moderate pain. 01/14/16   Shade Flood, MD  valACYclovir (VALTREX) 1000 MG tablet Take 1 tablet (1,000 mg total) by mouth 3 (three) times daily. 01/14/16   Shade Flood, MD      Allergies    Patient has no known allergies.    Review of Systems   Review of Systems 10 systems reviewed negative except as per HPI Physical Exam Updated Vital Signs BP 115/77 (BP Location: Right Arm)   Pulse 83   Temp (!) 97.4 F (36.3 C) (Oral)   Resp 16   SpO2 98%  Physical Exam Constitutional:      Comments: Alert, nontoxic, clear mental status.  HENT:     Head: Normocephalic and atraumatic.     Right Ear: Tympanic membrane normal.     Left Ear: Tympanic membrane normal.     Nose: Nose normal.     Mouth/Throat:     Mouth: Mucous membranes are moist.     Pharynx: Oropharynx is clear.  Eyes:     Extraocular Movements: Extraocular movements intact.     Conjunctiva/sclera: Conjunctivae normal.     Pupils: Pupils are equal, round, and reactive to light.  Neck:     Comments: No thyromegaly no meningismus Cardiovascular:     Rate and Rhythm: Normal rate and regular rhythm.  Pulses: Normal pulses.     Heart sounds: Normal heart sounds.  Pulmonary:     Effort: Pulmonary effort is normal.     Breath sounds: Normal breath sounds.  Abdominal:     General: There is no distension.     Palpations: Abdomen is soft.     Tenderness: There is no abdominal tenderness. There is no guarding.  Musculoskeletal:        General: No swelling or tenderness. Normal range of motion.     Cervical back: Neck supple.     Right lower leg: No edema.     Left lower leg: No edema.  Lymphadenopathy:     Cervical: No cervical adenopathy.  Skin:    General: Skin is warm and dry.  Neurological:     General: No focal deficit present.     Mental Status: He is oriented to person, place, and time.     Cranial Nerves: No cranial nerve deficit.     Motor: No weakness.     Coordination:  Coordination normal.     Comments: Mental status is clear.  Speech is clear.  Finger-nose exam intact bilaterally.  Heel-to-shin exam intact bilaterally.  Transitioning from supine to sitting patient had subtle lateral nystagmus to the right.  No pronounced nystagmus and normal extraocular motions.  Psychiatric:        Mood and Affect: Mood normal.     ED Results / Procedures / Treatments   Labs (all labs ordered are listed, but only abnormal results are displayed) Labs Reviewed  BASIC METABOLIC PANEL - Abnormal; Notable for the following components:      Result Value   Glucose, Bld 136 (*)    All other components within normal limits  CBG MONITORING, ED - Abnormal; Notable for the following components:   Glucose-Capillary 136 (*)    All other components within normal limits  CBC WITH DIFFERENTIAL/PLATELET    EKG EKG Interpretation  Date/Time:  Friday November 07 2021 09:13:44 EDT Ventricular Rate:  81 PR Interval:  167 QRS Duration: 87 QT Interval:  371 QTC Calculation: 431 R Axis:   36 Text Interpretation: Sinus rhythm normal, no old comparison Confirmed by Arby Barrette [phone]) on 11/07/2021 10:01:02 AM  Radiology MR Brain Wo Contrast (neuro protocol)  Result Date: 11/07/2021 CLINICAL DATA:  Neuro deficit, acute, stroke suspected. Vertigo r/o central cause. Acute onset of dizziness today with nausea, vomiting, and ear pain. EXAM: MRI HEAD WITHOUT CONTRAST TECHNIQUE: Multiplanar, multiecho pulse sequences of the brain and surrounding structures were obtained without intravenous contrast. COMPARISON:  None Available. FINDINGS: Brain: There is no evidence of an acute infarct, intracranial hemorrhage, mass, midline shift, or extra-axial fluid collection. The ventricles and sulci are normal. The cerebellar tonsils are normally positioned. The brain is normal in signal. Vascular: Major intracranial vascular flow voids are preserved. Skull and upper cervical spine: Unremarkable bone  marrow signal. Sinuses/Orbits: Unremarkable orbits. Mucosal thickening and fluid/secretions in the left maxillary sinus. Clear mastoid air cells. Other: None. IMPRESSION: 1. Unremarkable appearance of the brain. 2. Mucosal thickening and fluid in the left maxillary sinus, correlate for acute sinusitis. Electronically Signed   By: Sebastian Ache M.D.   On: 11/07/2021 12:08    Procedures Procedures    Medications Ordered in ED Medications  sodium chloride 0.9 % bolus 1,000 mL (0 mLs Intravenous Stopped 11/07/21 1019)  meclizine (ANTIVERT) tablet 25 mg (25 mg Oral Given 11/07/21 1102)  diazepam (VALIUM) injection 2.5 mg (2.5 mg Intravenous Given 11/07/21  25)    ED Course/ Medical Decision Making/ A&P                           Medical Decision Making Amount and/or Complexity of Data Reviewed Radiology: ordered.  Risk Prescription drug management.   Patient present with acute onset of vertigo, imbalance and vomiting.  Differential diagnosis includes cerebellar stroke\benign positional vertigo/intracranial mass or tumor.  Patient was very symptomatic with position change.  IV fluid resuscitation initiated and meclizine orally and Valium IV administered.  We will proceed with MRI to rule out CVA or mass.  Patient had good response to meclizine and Valium.  Much improved able to sit up in the bed without vomiting.  MRI reviewed by radiology does not show any acute findings intracranially.  There is presence of likely acute sinusitis.  Reviewed labs.  No metabolic derangements and no significant leukocytosis to suggest infectious etiology.  Patient is nontoxic.  Given vertigo and appearance of sinusitis on MRI will opt to treat with Augmentin.  Also will provide meclizine and Zofran for symptomatic control.  I have reviewed all the diagnostic findings and plan for medications and follow-up with the patient with [place] translator service.  Patient voices understanding and is discharged in good  condition with clear mental status and symptoms well controlled.        Final Clinical Impression(s) / ED Diagnoses Final diagnoses:  Vertigo  Acute non-recurrent sinusitis, unspecified location    Rx / DC Orders ED Discharge Orders          Ordered    amoxicillin-clavulanate (AUGMENTIN) 875-125 MG tablet  Every 12 hours        11/07/21 1615    meclizine (ANTIVERT) 25 MG tablet  3 times daily PRN        11/07/21 1615    ondansetron (ZOFRAN-ODT) 4 MG disintegrating tablet  Every 4 hours PRN        11/07/21 1615              Arby Barrette, MD 11/07/21 1629

## 2021-12-20 ENCOUNTER — Ambulatory Visit
Admission: EM | Admit: 2021-12-20 | Discharge: 2021-12-20 | Disposition: A | Discharge status: Home / Self Care | Payer: 59 | Attending: Family Medicine | Admitting: Family Medicine

## 2021-12-20 DIAGNOSIS — B009 Herpesviral infection, unspecified: Secondary | ICD-10-CM

## 2021-12-20 DIAGNOSIS — K069 Disorder of gingiva and edentulous alveolar ridge, unspecified: Secondary | ICD-10-CM | POA: Diagnosis not present

## 2021-12-20 DIAGNOSIS — B084 Enteroviral vesicular stomatitis with exanthem: Secondary | ICD-10-CM | POA: Diagnosis not present

## 2021-12-20 MED ORDER — VALACYCLOVIR HCL 1 G PO TABS: 2000.0000 mg | ORAL_TABLET | Freq: Two times a day (BID) | ORAL | 0 refills | Status: AC

## 2021-12-20 MED ORDER — LIDOCAINE VISCOUS HCL 2 % MT SOLN: 5.0000 mL | Freq: Four times a day (QID) | OROMUCOSAL | 0 refills | Status: AC | PRN

## 2021-12-20 NOTE — Discharge Instructions (Addendum)
"  Magic mouthwash"--it's a mix of lidocaine, benadryl and mylanta--Swish 5 ml in your mouth and spit 4 times daily as needed for mouth pain  Valacyclovir 1000 mg--take 2 tablets by mouth every 12 hours for 2 doses

## 2021-12-20 NOTE — ED Triage Notes (Signed)
Pt presents with lesions in mouth and painful tongue X 3 days.

## 2021-12-20 NOTE — ED Provider Notes (Signed)
[facility]    CSN: [id number] Arrival date & time: 12/20/21  1353      History   Chief Complaint Chief Complaint  Patient presents with   Mouth Lesions    HPI Thomas Cantu is a 46 y.o. male.    Mouth Lesions  Here for painful sores in his mouth.  Since about October 12, he has had painful sores in his upper and lower lips and on his tongue and his gums.  No fever or chills.  No nausea or vomiting. No cough or congestion   No known sick contacts.  No rash on his hands or feet   History reviewed. No pertinent past medical history.  There are no problems to display for this patient.   History reviewed. No pertinent surgical history.     Home Medications    Prior to Admission medications   Medication Sig Start Date End Date Taking? Authorizing Provider  magic mouthwash (lidocaine, diphenhydrAMINE, alum & mag hydroxide) suspension Swish and spit 5 mLs 4 (four) times daily as needed for mouth pain. 12/20/21  Yes Larrie Fraizer, Gwenlyn Perking, MD  valACYclovir (VALTREX) 1000 MG tablet Take 2 tablets (2,000 mg total) by mouth 2 (two) times daily for 2 doses. 12/20/21 12/21/21 Yes Barrett Henle, MD  meclizine (ANTIVERT) 25 MG tablet Take 1 tablet (25 mg total) by mouth 3 (three) times daily as needed for dizziness. 11/07/21   Charlesetta Shanks, MD    Family History Family History  Problem Relation Age of Onset   Diabetes Mother    Diabetes Father     Social History Social History   Tobacco Use   Smoking status: Every Day    Packs/day: 0.75    Years: 20.00    Total pack years: 15.00    Types: Cigarettes   Smokeless tobacco: Never  Substance Use Topics   Alcohol use: No   Drug use: No     Allergies   Patient has no known allergies.   Review of Systems Review of Systems  HENT:  Positive for mouth sores.      Physical Exam Triage Vital Signs ED Triage Vitals  Enc Vitals Group     BP 12/20/21 1424 131/87     Pulse Rate 12/20/21 1424 79      Resp 12/20/21 1424 18     Temp 12/20/21 1424 98.4 F (36.9 C)     Temp Source 12/20/21 1424 Oral     SpO2 12/20/21 1424 96 %     Weight --      Height --      Head Circumference --      Peak Flow --      Pain Score 12/20/21 1426 4     Pain Loc --      Pain Edu? --      Excl. in [place]? --    No data found.  Updated Vital Signs BP 131/87 (BP Location: Left Arm)   Pulse 79   Temp 98.4 F (36.9 C) (Oral)   Resp 18   SpO2 96%   Visual Acuity Right Eye Distance:   Left Eye Distance:   Bilateral Distance:    Right Eye Near:   Left Eye Near:    Bilateral Near:     Physical Exam Vitals reviewed.  Constitutional:      General: He is not in acute distress.    Appearance: He is not ill-appearing, toxic-appearing or diaphoretic.  HENT:     Mouth/Throat:  Mouth: Mucous membranes are moist.     Pharynx: No oropharyngeal exudate or posterior oropharyngeal erythema.     Comments: There is some tiny ulcerations on the border of his tongue bilaterally, under his tongue, and on his inner lips upper and lower. Skin:    Capillary Refill: Capillary refill takes less than 2 seconds.     Coloration: Skin is not jaundiced or pale.  Neurological:     Mental Status: He is alert and oriented to person, place, and time.  Psychiatric:        Behavior: Behavior normal.      UC Treatments / Results  Labs (all labs ordered are listed, but only abnormal results are displayed) Labs Reviewed - No data to display  EKG   Radiology No results found.  Procedures Procedures (including critical care time)  Medications Ordered in UC Medications - No data to display  Initial Impression / Assessment and Plan / UC Course  I have reviewed the triage vital signs and the nursing notes.  Pertinent labs & imaging results that were available during my care of the patient were reviewed by me and considered in my medical decision making (see chart for details).        I think this is  most consistent with hand-foot-and-mouth disease.  I am going to treat for oral HSV in case.  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 Provider   valACYclovir (VALTREX) 1000 MG tablet Take 2 tablets (2,000 mg total) by mouth 2 (two) times daily for 2 doses. 4 tablet Barrett Henle, MD   magic mouthwash (lidocaine, diphenhydrAMINE, alum & mag hydroxide) suspension Swish and spit 5 mLs 4 (four) times daily as needed for mouth pain. 360 mL Barrett Henle, MD      PDMP not reviewed this encounter.   Barrett Henle, MD 12/20/21 1501

## 2022-02-11 ENCOUNTER — Other Ambulatory Visit: Payer: Self-pay | Admitting: Nurse Practitioner

## 2022-02-11 DIAGNOSIS — R69 Illness, unspecified: Secondary | ICD-10-CM | POA: Diagnosis not present

## 2022-02-11 DIAGNOSIS — B181 Chronic viral hepatitis B without delta-agent: Secondary | ICD-10-CM

## 2022-02-11 DIAGNOSIS — K7401 Hepatic fibrosis, early fibrosis: Secondary | ICD-10-CM | POA: Diagnosis not present

## 2022-02-11 DIAGNOSIS — K760 Fatty (change of) liver, not elsewhere classified: Secondary | ICD-10-CM | POA: Diagnosis not present

## 2022-02-25 ENCOUNTER — Ambulatory Visit
Admission: RE | Admit: 2022-02-25 | Discharge: 2022-02-25 | Disposition: A | Discharge status: Home / Self Care | Payer: 59 | Source: Ambulatory Visit | Attending: Nurse Practitioner | Admitting: Nurse Practitioner

## 2022-02-25 DIAGNOSIS — B181 Chronic viral hepatitis B without delta-agent: Secondary | ICD-10-CM

## 2022-02-25 DIAGNOSIS — K760 Fatty (change of) liver, not elsewhere classified: Secondary | ICD-10-CM | POA: Diagnosis not present

## 2022-03-18 DIAGNOSIS — Z0000 Encounter for general adult medical examination without abnormal findings: Secondary | ICD-10-CM | POA: Diagnosis not present

## 2022-08-19 ENCOUNTER — Other Ambulatory Visit: Payer: Self-pay | Admitting: Nurse Practitioner

## 2022-08-19 DIAGNOSIS — K760 Fatty (change of) liver, not elsewhere classified: Secondary | ICD-10-CM

## 2022-08-19 DIAGNOSIS — B181 Chronic viral hepatitis B without delta-agent: Secondary | ICD-10-CM

## 2022-08-19 DIAGNOSIS — K7401 Hepatic fibrosis, early fibrosis: Secondary | ICD-10-CM

## 2022-08-24 ENCOUNTER — Ambulatory Visit
Admission: RE | Admit: 2022-08-24 | Discharge: 2022-08-24 | Disposition: A | Discharge status: Home / Self Care | Payer: 59 | Source: Ambulatory Visit | Attending: Nurse Practitioner | Admitting: Nurse Practitioner

## 2022-08-24 DIAGNOSIS — B181 Chronic viral hepatitis B without delta-agent: Secondary | ICD-10-CM

## 2022-08-24 DIAGNOSIS — K7401 Hepatic fibrosis, early fibrosis: Secondary | ICD-10-CM

## 2022-08-24 DIAGNOSIS — K760 Fatty (change of) liver, not elsewhere classified: Secondary | ICD-10-CM

## 2022-12-08 IMAGING — US US ABDOMEN LIMITED
1 series · 14 of 25 positions shown · non-contrast
Comparison: Abdominal ultrasound 02/24/2021

CLINICAL DATA: Hepatitis-B

EXAM:
ULTRASOUND ABDOMEN LIMITED RIGHT UPPER QUADRANT

[Series 1: us abdomen limited · 0.20mm/px · 14 of 47 slices shown]
[im 1/47]
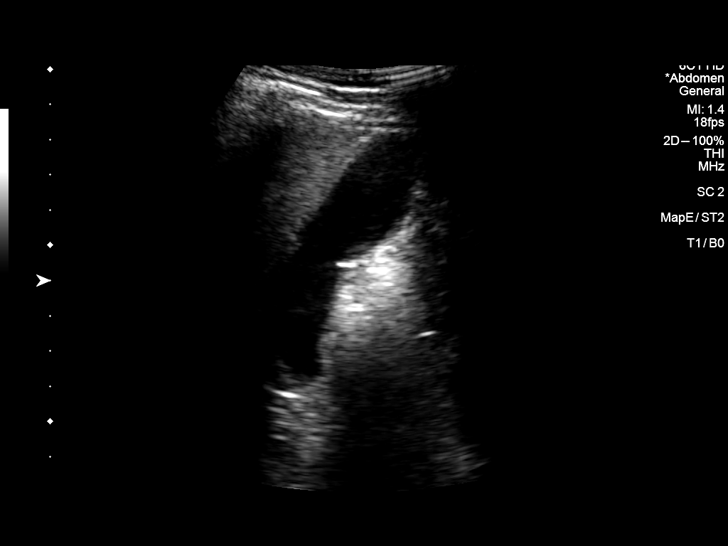
[im 4/47]
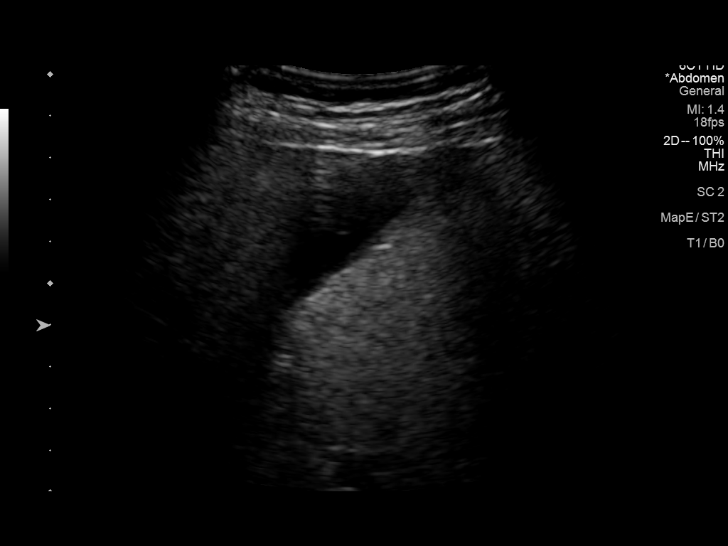
[im 8/47]
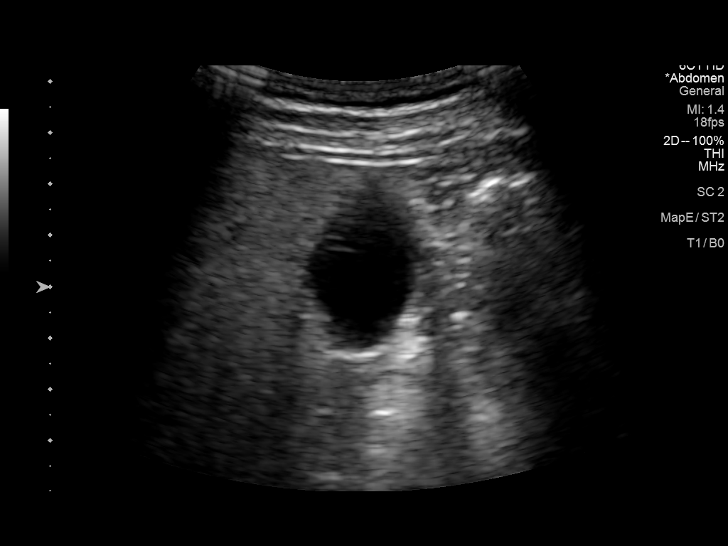
[im 12/47]
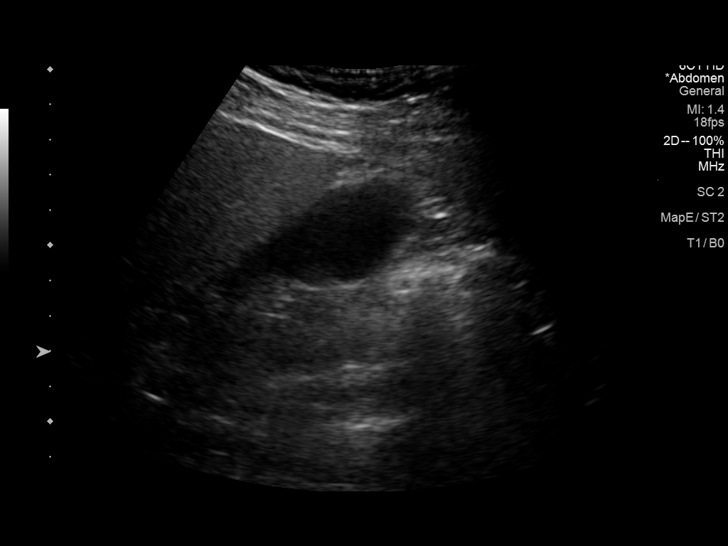
[im 16/47]
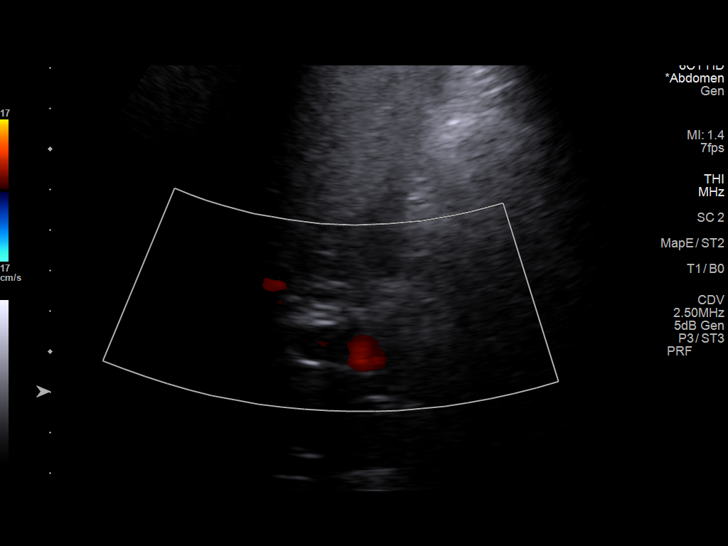
[im 18/47]
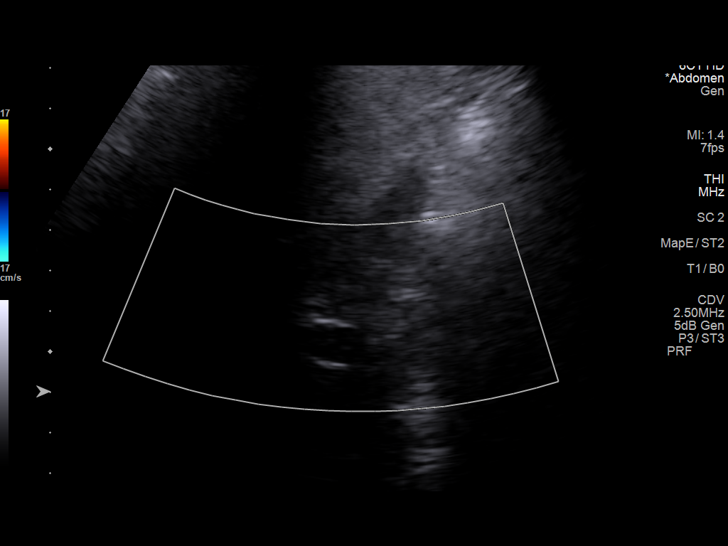
[im 22/47]
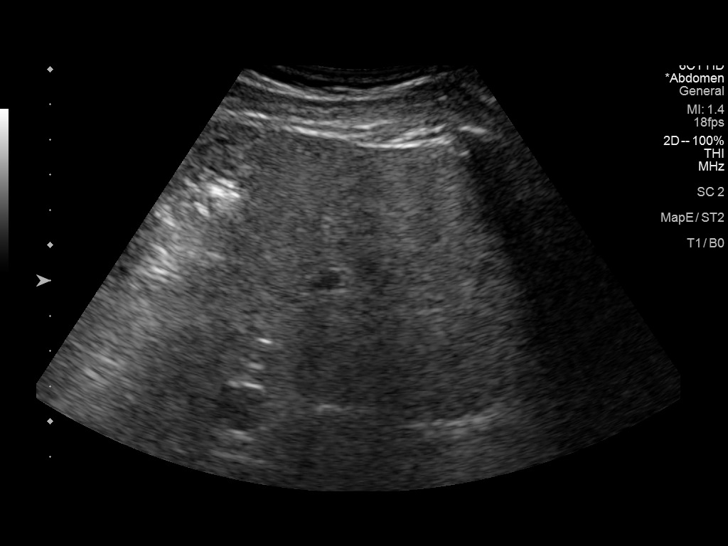
[im 25/47]
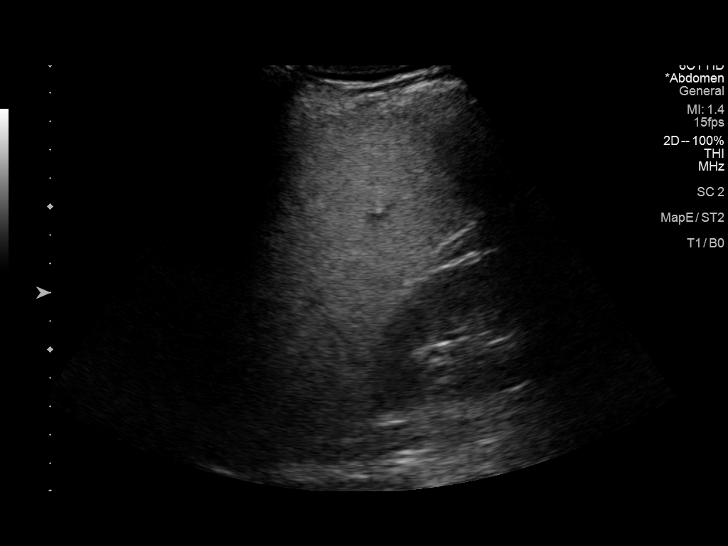
[im 29/47]
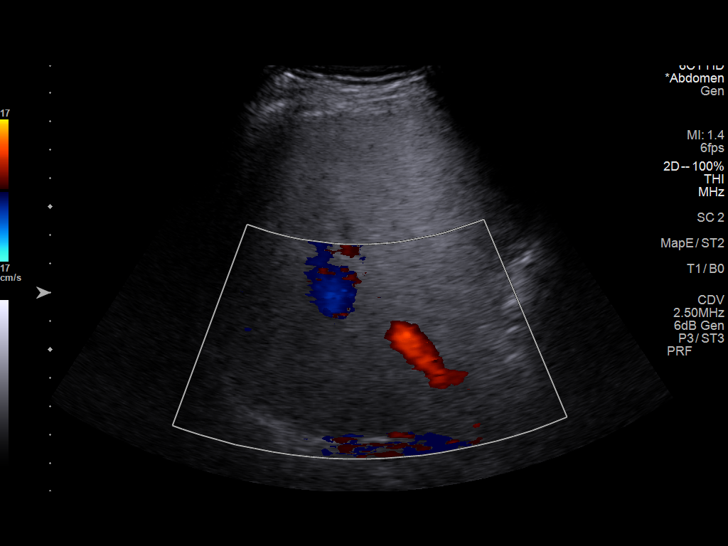
[im 31/47]
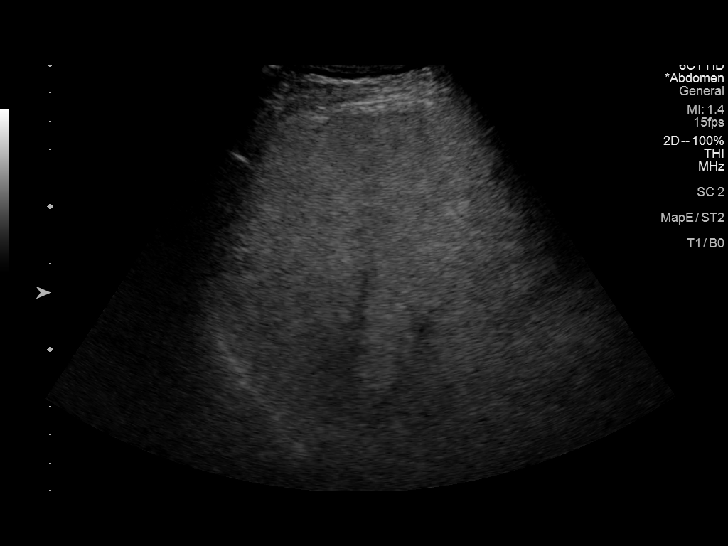
[im 35/47]
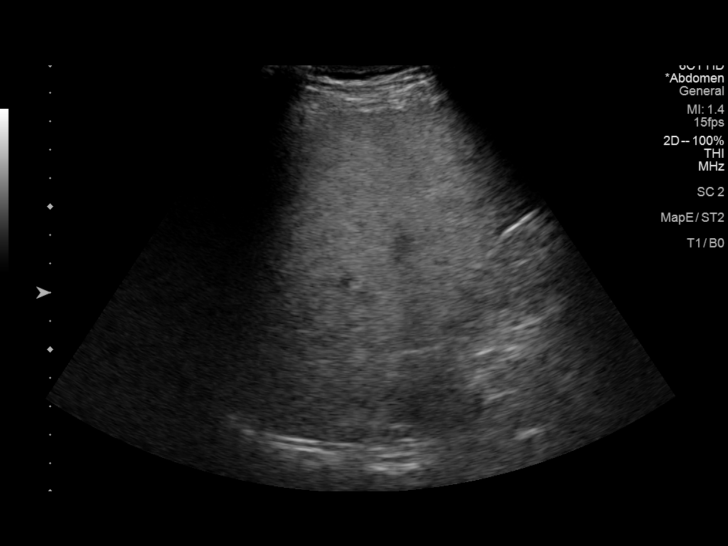
[im 39/47]
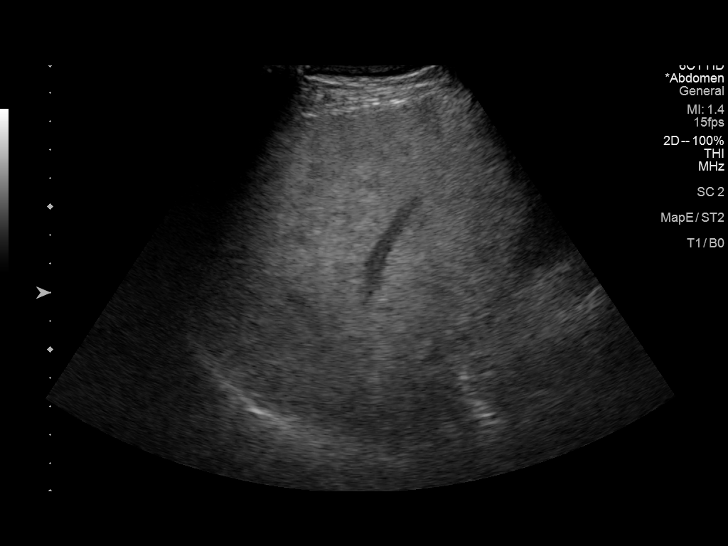
[im 43/47]
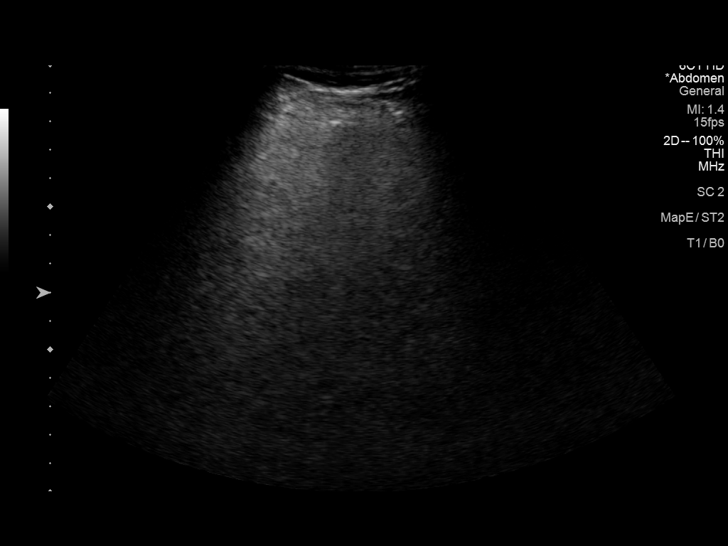
[im 47/47]
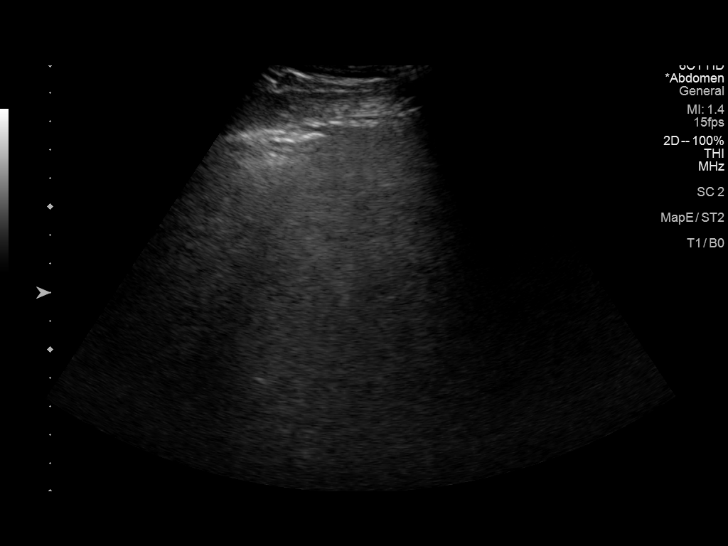

[14 of 25 positions shown; findings below may reference images not displayed]

FINDINGS: Gallbladder:

No gallstones or wall thickening visualized. No sonographic Murphy
sign noted by sonographer.

Common bile duct:

Diameter: 4 mm

Liver:

Coarse, increased echogenicity of the parenchyma with no focal mass
identified. Portal vein is patent on color Doppler imaging with
normal direction of blood flow towards the liver.

Other: None.
IMPRESSION: Abnormal appearance of the liver parenchyma suggesting hepatic
steatosis and/or other hepatocellular disease.

## 2023-02-17 ENCOUNTER — Other Ambulatory Visit: Payer: Self-pay | Admitting: Nurse Practitioner

## 2023-02-17 DIAGNOSIS — K7401 Hepatic fibrosis, early fibrosis: Secondary | ICD-10-CM

## 2023-02-17 DIAGNOSIS — B180 Chronic viral hepatitis B with delta-agent: Secondary | ICD-10-CM

## 2023-02-24 ENCOUNTER — Ambulatory Visit
Admission: RE | Admit: 2023-02-24 | Discharge: 2023-02-24 | Disposition: A | Discharge status: Home / Self Care | Payer: 59 | Source: Ambulatory Visit | Attending: Nurse Practitioner | Admitting: Nurse Practitioner

## 2023-02-24 DIAGNOSIS — B180 Chronic viral hepatitis B with delta-agent: Secondary | ICD-10-CM

## 2023-02-24 DIAGNOSIS — K7401 Hepatic fibrosis, early fibrosis: Secondary | ICD-10-CM

## 2023-09-01 ENCOUNTER — Other Ambulatory Visit: Payer: Self-pay | Admitting: Nurse Practitioner

## 2023-09-01 DIAGNOSIS — K7401 Hepatic fibrosis, early fibrosis: Secondary | ICD-10-CM

## 2023-09-01 DIAGNOSIS — B181 Chronic viral hepatitis B without delta-agent: Secondary | ICD-10-CM

## 2023-09-01 DIAGNOSIS — K760 Fatty (change of) liver, not elsewhere classified: Secondary | ICD-10-CM

## 2023-09-02 ENCOUNTER — Encounter: Payer: Self-pay | Admitting: Nurse Practitioner

## 2023-09-08 ENCOUNTER — Ambulatory Visit
Admission: RE | Admit: 2023-09-08 | Discharge: 2023-09-08 | Disposition: A | Discharge status: Home / Self Care | Source: Ambulatory Visit | Attending: Nurse Practitioner | Admitting: Nurse Practitioner

## 2023-09-08 DIAGNOSIS — B181 Chronic viral hepatitis B without delta-agent: Secondary | ICD-10-CM

## 2023-09-08 DIAGNOSIS — K760 Fatty (change of) liver, not elsewhere classified: Secondary | ICD-10-CM

## 2023-09-08 DIAGNOSIS — K7401 Hepatic fibrosis, early fibrosis: Secondary | ICD-10-CM

## 2024-03-15 ENCOUNTER — Other Ambulatory Visit: Payer: Self-pay | Admitting: Nurse Practitioner

## 2024-03-15 DIAGNOSIS — K7581 Nonalcoholic steatohepatitis (NASH): Secondary | ICD-10-CM

## 2024-03-15 DIAGNOSIS — B181 Chronic viral hepatitis B without delta-agent: Secondary | ICD-10-CM
